# Patient Record
Sex: Male | Born: 1985 | Race: White | Hispanic: No | Marital: Single | Smoking: Current every day smoker
Health system: Southern US, Community
[De-identification: ages and names within clinical notes are randomized; demographics above are authoritative.]

---

## 2015-11-28 ENCOUNTER — Emergency Department (HOSPITAL_COMMUNITY)
Admission: EM | Admit: 2015-11-28 | Discharge: 2015-11-28 | Disposition: A | Discharge status: Home / Self Care | Payer: Self-pay | Attending: Emergency Medicine | Admitting: Emergency Medicine

## 2015-11-28 ENCOUNTER — Encounter (HOSPITAL_COMMUNITY): Payer: Self-pay

## 2015-11-28 ENCOUNTER — Emergency Department (HOSPITAL_COMMUNITY): Payer: Self-pay

## 2015-11-28 DIAGNOSIS — Z791 Long term (current) use of non-steroidal anti-inflammatories (NSAID): Secondary | ICD-10-CM | POA: Insufficient documentation

## 2015-11-28 DIAGNOSIS — R070 Pain in throat: Secondary | ICD-10-CM | POA: Insufficient documentation

## 2015-11-28 DIAGNOSIS — F17200 Nicotine dependence, unspecified, uncomplicated: Secondary | ICD-10-CM | POA: Insufficient documentation

## 2015-11-28 DIAGNOSIS — R079 Chest pain, unspecified: Secondary | ICD-10-CM | POA: Insufficient documentation

## 2015-11-28 DIAGNOSIS — R1013 Epigastric pain: Secondary | ICD-10-CM | POA: Insufficient documentation

## 2015-11-28 LAB — COMPREHENSIVE METABOLIC PANEL
ALBUMIN: 3.9 g/dL (ref 3.5–5.0)
ALT: 17 U/L (ref 17–63)
ANION GAP: 5 (ref 5–15)
AST: 17 U/L (ref 15–41)
Alkaline Phosphatase: 60 U/L (ref 38–126)
BILIRUBIN TOTAL: 0.5 mg/dL (ref 0.3–1.2)
BUN: 11 mg/dL (ref 6–20)
CHLORIDE: 108 mmol/L (ref 101–111)
CO2: 24 mmol/L (ref 22–32)
Calcium: 8.8 mg/dL — ABNORMAL LOW (ref 8.9–10.3)
Creatinine, Ser: 0.68 mg/dL (ref 0.61–1.24)
GFR calc Af Amer: >60 mL/min (ref >60)
GFR calc non Af Amer: >60 mL/min (ref >60)
GLUCOSE: 96 mg/dL (ref 65–99)
POTASSIUM: 4.1 mmol/L (ref 3.5–5.1)
Sodium: 137 mmol/L (ref 135–145)
TOTAL PROTEIN: 6.6 g/dL (ref 6.5–8.1)

## 2015-11-28 LAB — CBC WITH DIFFERENTIAL/PLATELET
BASOS ABS: 0 10*3/uL (ref 0.0–0.1)
BASOS PCT: 0 %
Eosinophils Absolute: 0.3 10*3/uL (ref 0.0–0.7)
Eosinophils Relative: 2 %
HEMATOCRIT: 45.9 % (ref 39.0–52.0)
HEMOGLOBIN: 16 g/dL (ref 13.0–17.0)
LYMPHS PCT: 14 %
Lymphs Abs: 1.5 10*3/uL (ref 0.7–4.0)
MCH: 31.6 pg (ref 26.0–34.0)
MCHC: 34.9 g/dL (ref 30.0–36.0)
MCV: 90.7 fL (ref 78.0–100.0)
MONO ABS: 0.7 10*3/uL (ref 0.1–1.0)
Monocytes Relative: 7 %
NEUTROS ABS: 8.2 10*3/uL — AB (ref 1.7–7.7)
NEUTROS PCT: 77 %
Platelets: 168 10*3/uL (ref 150–400)
RBC: 5.06 MIL/uL (ref 4.22–5.81)
RDW: 12.6 % (ref 11.5–15.5)
WBC: 10.7 10*3/uL — ABNORMAL HIGH (ref 4.0–10.5)

## 2015-11-28 LAB — LIPASE, BLOOD: LIPASE: 16 U/L (ref 11–51)

## 2015-11-28 LAB — TROPONIN I: Troponin I: <0.03 ng/mL (ref <0.03)

## 2015-11-28 MED ORDER — FAMOTIDINE IN NACL 20-0.9 MG/50ML-% IV SOLN: 20.0000 mg | Freq: Once | INTRAVENOUS | Status: DC

## 2015-11-28 MED ORDER — ACETAMINOPHEN 500 MG PO TABS
1000.0000 mg | ORAL_TABLET | Freq: Once | ORAL | Status: AC
Start: 2015-11-28 — End: 2015-11-28
  Administered 2015-11-28: 1000 mg via ORAL
  Filled 2015-11-28: qty 2

## 2015-11-28 MED ORDER — GI COCKTAIL ~~LOC~~
30.0000 mL | Freq: Once | ORAL | Status: AC
  Administered 2015-11-28: 30 mL via ORAL
  Filled 2015-11-28: qty 30

## 2015-11-28 MED ORDER — KETOROLAC TROMETHAMINE 60 MG/2ML IM SOLN: 60.0000 mg | Freq: Once | INTRAMUSCULAR | Status: DC

## 2015-11-28 MED ORDER — KETOROLAC TROMETHAMINE 30 MG/ML IJ SOLN
30.0000 mg | Freq: Once | INTRAMUSCULAR | Status: AC
  Administered 2015-11-28: 30 mg via INTRAVENOUS
  Filled 2015-11-28: qty 1

## 2015-11-28 MED ORDER — FAMOTIDINE 20 MG PO TABS
20.0000 mg | ORAL_TABLET | Freq: Once | ORAL | Status: AC
  Administered 2015-11-28: 20 mg via ORAL
  Filled 2015-11-28: qty 1

## 2015-11-28 MED ORDER — OMEPRAZOLE 20 MG PO CPDR: 20.0000 mg | DELAYED_RELEASE_CAPSULE | Freq: Every day | ORAL | Status: DC

## 2015-11-28 NOTE — ED Notes (Signed)
Complain of pain from epigastric area up to throat. States he felt like his throat was closing up last night . States the pain is worse when bending forward.

## 2015-11-28 NOTE — Discharge Instructions (Signed)
Please quit smoking. Follow up with local physician.  If you were given medicines take as directed.  If you are on coumadin or contraceptives realize their levels and effectiveness is altered by many different medicines.  If you have any reaction (rash, tongues swelling, other) to the medicines stop taking and see a physician.    If your blood pressure was elevated in the ER make sure you follow up for management with a primary doctor or return for chest pain, shortness of breath or stroke symptoms.  Please follow up as directed and return to the ER or see a physician for new or worsening symptoms.  Thank you. Filed Vitals:   11/28/15 1201 11/28/15 1230 11/28/15 1300 11/28/15 1433  BP: 124/71 114/73 122/75 113/75  Pulse: 53 47 67 46  Temp:      TempSrc:      Resp: 16 17 12 18   Height:      Weight:      SpO2: 98% 97% 98% 97%

## 2015-11-28 NOTE — ED Notes (Signed)
Pt ambulating independently w/ steady gait on d/c in no acute distress, A&Ox4. D/c instructions reviewed w/ pt - pt denies any further questions or concerns at present. Rx given x1  

## 2015-11-28 NOTE — ED Provider Notes (Signed)
CSN: [id number]     Arrival date & time 11/28/15  1054 History  By signing my name below, I, Rosario Adie, attest that this documentation has been prepared under the direction and in the presence of Blane Ohara, MD. Electronically Signed: Rosario Adie, ED Scribe. 11/28/2015. 12:06 PM.   Chief Complaint  Patient presents with  . epigastric pain    The history is provided by the patient. No language interpreter was used.   HPI Comments: Jacob Carrillo is a 30 y.o. male with no pertinent PMHx who presents to the Emergency Department complaining of sudden onset, unchanged, intermittent throat pain x 1 day. His pain radiates down his the middle of his chest under his sternum. His pain is exacerbated with deep breaths and leaning forward. He took one dose of Ibuprofen approximately 3 hours PTA with no relief of his episodes. No Hx or FHx of PE/DVT, recent long travel, surgery, fracture, prolonged immobilization, hormone use. No hx of GERD. No recent sickness or illness. He denies leg swelling, nausea, fever, chills. vomiting, or abdominal pain.   History reviewed. No pertinent past medical history. History reviewed. No pertinent past surgical history. No family history on file. Social History  Substance Use Topics  . Smoking status: Current Every Day Smoker  . Smokeless tobacco: None  . Alcohol Use: Yes    Review of Systems  Constitutional: Negative for fever, chills and diaphoresis.  HENT:       Positive for throat pain.  Cardiovascular: Positive for chest pain. Negative for leg swelling.  Gastrointestinal: Negative for nausea, vomiting and abdominal pain.  Neurological: Negative for dizziness.    Allergies  Review of patient's allergies indicates no known allergies.  Home Medications   Prior to Admission medications   Medication Sig Start Date End Date Taking? Authorizing Provider  ibuprofen (ADVIL,MOTRIN) 200 MG tablet Take 600 mg by mouth every 6 (six) hours as  needed for mild pain.   Yes Historical Provider, MD  omeprazole (PRILOSEC) 20 MG capsule Take 1 capsule (20 mg total) by mouth daily. 11/28/15   Blane Ohara, MD   BP 113/75 mmHg  Pulse 46  Temp(Src) 97.4 F (36.3 C) (Oral)  Resp 18  Ht  (1.854 m)  Wt 200 lb (90.719 kg)  BMI 26.39 kg/m2  SpO2 97% Physical Exam  Constitutional: He is oriented to person, place, and time. He appears well-developed and well-nourished.  HENT:  Head: Normocephalic.  Mouth/Throat: Mucous membranes are dry.  Throat is clear. No swelling or exudate.   Eyes: Conjunctivae are normal.  Neck: Normal range of motion. Neck supple.  Cardiovascular: Normal rate.   Pulmonary/Chest: Effort normal and breath sounds normal. No respiratory distress. He has no wheezes. He has no rales.  Lungs CTA bilaterally.  Abdominal: Soft. Bowel sounds are normal. He exhibits no distension. There is no rebound and no guarding.  Mild discomfort in the epigastric region, no peritonitis, no significant tenderness.   Musculoskeletal: Normal range of motion.  No significant leg swelling.   Neurological: He is alert and oriented to person, place, and time.  Skin: Skin is warm and dry.  Psychiatric: He has a normal mood and affect. His behavior is normal.  Nursing note and vitals reviewed.  ED Course  Procedures (including critical care time)  DIAGNOSTIC STUDIES: Oxygen Saturation is 98% on RA, normal by my interpretation.   COORDINATION OF CARE: 12:06 PM-Discussed next steps with pt. Pt verbalized understanding and is agreeable with the plan.  Labs Review Labs Reviewed  COMPREHENSIVE METABOLIC PANEL - Abnormal; Notable for the following:    Calcium 8.8 (*)    All other components within normal limits  CBC WITH DIFFERENTIAL/PLATELET - Abnormal; Notable for the following:    WBC 10.7 (*)    Neutro Abs 8.2 (*)    All other components within normal limits  LIPASE, BLOOD  TROPONIN I    Imaging Review Dg Chest 2  View  11/28/2015  CLINICAL DATA:  Mid to upper chest pain with burning sensation for 3 hours, shortness of breath today. Smoker. EXAM: CHEST  2 VIEW COMPARISON:  None. FINDINGS: Cardiomediastinal silhouette is normal in size and configuration. Lungs are clear. Lung volumes are normal. No evidence of pneumonia. No pleural effusion. No pneumothorax. Osseous and soft tissue structures about the chest are unremarkable. IMPRESSION: No active cardiopulmonary disease. Electronically Signed   By: Bary RichardStan  Maynard M.D.   On: 11/28/2015 12:23    I have personally reviewed and evaluated these images and lab results as part of my medical decision-making.   EKG Interpretation   Date/Time:  Monday November 28 2015 12:00:50 EDT Ventricular Rate:  61 PR Interval:    QRS Duration: 90 QT Interval:  391 QTC Calculation: 394 R Axis:   69 Text Interpretation:  Sinus rhythm Confirmed by Iyona Pehrson MD, Danaysha Kirn [phone]  on 11/28/2015 1:07:49 PM      MDM   Final diagnoses:  Epigastric pain   Patient presents with mild chest discomfort worse with position and inspiration. Patient well-appearing on exam, vitals unremarkable. No FH blood clots.  Lungs are clear no respiratory difficulty. Patient PE RC negative. Patient very low risk cardiac and atypical presentation that started with burning in the throat and now has sensation epigastric region. Medicines given in the ER mild improvement. Discussed close follow-up with primary doctor. Discussed smoking cessation.  Results and differential diagnosis were discussed with the patient/parent/guardian. Xrays were independently reviewed by myself.  Close follow up outpatient was discussed, comfortable with the plan.   Medications  gi cocktail (Maalox,Lidocaine,Donnatal) (30 mLs Oral Given 11/28/15 1215)  acetaminophen (TYLENOL) tablet 1,000 mg (1,000 mg Oral Given 11/28/15 1215)  famotidine (PEPCID) tablet 20 mg (20 mg Oral Given 11/28/15 1215)  ketorolac (TORADOL) 30 MG/ML  injection 30 mg (30 mg Intravenous Given 11/28/15 1323)    Filed Vitals:   11/28/15 1201 11/28/15 1230 11/28/15 1300 11/28/15 1433  BP: 124/71 114/73 122/75 113/75  Pulse: 53 47 67 46  Temp:      TempSrc:      Resp: 16 17 12 18   Height:      Weight:      SpO2: 98% 97% 98% 97%    Final diagnoses:  Epigastric pain      Blane OharaJoshua Lanasia Porras, MD 11/28/15 1459

## 2015-11-28 NOTE — ED Notes (Addendum)
Pt reports acute onset of chest pain that woke him from his sleep, denies any alleviating factors - pain is worse w/ deep inspiration - pt unsure if pain is acid reflux. Pt denies any hx of anxiety/panic attacks. Pt A&Ox4, no acute distress. Placed on cardiac monitor. Pt reports taking ibuprofen at 09:20 and has had no relief in symptoms. Denies dizziness, diaphoresis or n/v.

## 2019-08-05 ENCOUNTER — Emergency Department (HOSPITAL_COMMUNITY)
Admission: EM | Admit: 2019-08-05 | Discharge: 2019-08-05 | Disposition: A | Discharge status: Home / Self Care | Payer: Self-pay | Attending: Emergency Medicine | Admitting: Emergency Medicine

## 2019-08-05 ENCOUNTER — Other Ambulatory Visit: Payer: Self-pay

## 2019-08-05 ENCOUNTER — Encounter (HOSPITAL_COMMUNITY): Payer: Self-pay | Admitting: Emergency Medicine

## 2019-08-05 DIAGNOSIS — J0110 Acute frontal sinusitis, unspecified: Secondary | ICD-10-CM | POA: Insufficient documentation

## 2019-08-05 DIAGNOSIS — F17200 Nicotine dependence, unspecified, uncomplicated: Secondary | ICD-10-CM | POA: Insufficient documentation

## 2019-08-05 DIAGNOSIS — Z20822 Contact with and (suspected) exposure to covid-19: Secondary | ICD-10-CM | POA: Insufficient documentation

## 2019-08-05 LAB — SARS CORONAVIRUS 2 (TAT 6-24 HRS): SARS Coronavirus 2: NEGATIVE

## 2019-08-05 MED ORDER — AMOXICILLIN-POT CLAVULANATE 875-125 MG PO TABS: 1.0000 | ORAL_TABLET | Freq: Two times a day (BID) | ORAL | 0 refills | Status: DC

## 2019-08-05 NOTE — ED Provider Notes (Signed)
[facility] EMERGENCY DEPARTMENT Provider Note   CSN: [id number] Arrival date & time: 08/05/19  1217     History Chief Complaint  Patient presents with  . Nasal Congestion    Jacob Carrillo is a 34 y.o. male with no significant past medical history presents for evaluation of nasal congestion and rhinorrhea.  Symptoms x1 week.  Has been taking Mucinex without relief.  Yellow color to rhinorrhea.  Also admits to some nonproductive cough and congestion drips on the back of his throat.  He denies any headache, lightheadedness, dizziness, sore throat, chest pain, shortness of breath, abdominal pain, diarrhea, dysuria.  Also admits to some frontal sinus pressure.  She notes aggravating relieving factors.  Tolerating p.o. intake at home.  Denies any additional aggravating relieving factors.  History obtained from patient and past medical records.  No interpreter is used.  HPI     History reviewed. No pertinent past medical history.  There are no problems to display for this patient.   History reviewed. No pertinent surgical history.     No family history on file.  Social History   Tobacco Use  . Smoking status: Current Every Day Smoker    Packs/day: 1.00  . Smokeless tobacco: Never Used  Substance Use Topics  . Alcohol use: Yes    Alcohol/week: 7.0 - 10.0 standard drinks    Types: 7 - 10 Shots of liquor per week  . Drug use: Not on file    Home Medications Prior to Admission medications   Medication Sig Start Date End Date Taking? Authorizing Provider  amoxicillin-clavulanate (AUGMENTIN) 875-125 MG tablet Take 1 tablet by mouth every 12 (twelve) hours. 08/05/19   Taleisha Kaczynski A, PA-C  ibuprofen (ADVIL,MOTRIN) 200 MG tablet Take 600 mg by mouth every 6 (six) hours as needed for mild pain.    [provider]  omeprazole (PRILOSEC) 20 MG capsule Take 1 capsule (20 mg total) by mouth daily. 11/28/15   Blane Ohara, MD    Allergies    Patient has no known  allergies.  Review of Systems   Review of Systems  Constitutional: Negative.   HENT: Positive for congestion, postnasal drip, rhinorrhea, sinus pressure and sinus pain. Negative for dental problem, drooling, ear discharge, ear pain, facial swelling, sneezing, sore throat, trouble swallowing and voice change.   Respiratory: Positive for cough. Negative for choking, chest tightness, shortness of breath, wheezing and stridor.   Cardiovascular: Negative.   Gastrointestinal: Negative.   Genitourinary: Negative.   Musculoskeletal: Negative.   Skin: Negative.   Neurological: Negative.   All other systems reviewed and are negative.   Physical Exam Updated Vital Signs BP (!) 142/82 (BP Location: Right Arm)   Pulse 87   Temp 98.1 F (36.7 C) (Oral)   Resp 18   Ht 6\' 1"  (1.854 m)   Wt 108.9 kg   SpO2 99%   BMI 31.66 kg/m   Physical Exam Vitals and nursing note reviewed.  Constitutional:      General: He is not in acute distress.    Appearance: He is not ill-appearing, toxic-appearing or diaphoretic.  HENT:     Head: Normocephalic and atraumatic.     Jaw: There is normal jaw occlusion.     Right Ear: Tympanic membrane, ear canal and external ear normal. There is no impacted cerumen. No hemotympanum. Tympanic membrane is not injected, scarred, perforated, erythematous, retracted or bulging.     Left Ear: Tympanic membrane, ear canal and external ear normal. There is  no impacted cerumen. No hemotympanum. Tympanic membrane is not injected, scarred, perforated, erythematous, retracted or bulging.     Ears:     Comments: No Mastoid tenderness.    Nose: Mucosal edema, congestion and rhinorrhea present. No nasal deformity, signs of injury or nasal tenderness.     Right Turbinates: Enlarged and swollen.     Left Turbinates: Enlarged and swollen.     Right Sinus: Frontal sinus tenderness present. No maxillary sinus tenderness.     Left Sinus: Frontal sinus tenderness present. No maxillary  sinus tenderness.      Comments: Clear rhinorrhea and congestion to bilateral nares.  No sinus tenderness.    Mouth/Throat:     Comments: Posterior oropharynx clear.  Mucous membranes moist.  Tonsils without erythema or exudate.  Uvula midline without deviation.  No evidence of PTA or RPA.  No drooling, dysphasia or trismus.  Phonation normal. Neck:     Trachea: Trachea and phonation normal.     Meningeal: Brudzinski's sign and Kernig's sign absent.     Comments: No Neck stiffness or neck rigidity.  No meningismus.  No cervical lymphadenopathy. Cardiovascular:     Comments: No murmurs rubs or gallops. Pulmonary:     Comments: Clear to auscultation bilaterally without wheeze, rhonchi or rales.  No accessory muscle usage.  Able speak in full sentences. Abdominal:     Comments: Soft, nontender without rebound or guarding.  No CVA tenderness.  Musculoskeletal:     Comments: Moves all 4 extremities without difficulty.  Lower extremities without edema, erythema or warmth.  Skin:    Comments: Brisk capillary refill.  No rashes or lesions.  Neurological:     Mental Status: He is alert.     Comments: Ambulatory in department without difficulty.  Cranial nerves II through XII grossly intact.  No facial droop.  No aphasia.     ED Results / Procedures / Treatments   Labs (all labs ordered are listed, but only abnormal results are displayed) Labs Reviewed  SARS CORONAVIRUS 2 (TAT 6-24 HRS)    EKG None  Radiology No results found.  Procedures Procedures (including critical care time)  Medications Ordered in ED Medications - No data to display  ED Course  I have reviewed the triage vital signs and the nursing notes.  Pertinent labs & imaging results that were available during my care of the patient were reviewed by me and considered in my medical decision making (see chart for details).  34 year old male appears otherwise well presents for evaluation of congestion and rhinorrhea.   Symptoms x1 week.  He is afebrile, nonseptic, non-ill-appearing.  Patient with tenderness over his frontal sinuses as well as congestion yellow rhinorrhea.  Posterior oropharynx clear.  No evidence of PTA or RPA.  Heart and lungs clear.  No chest pain, shortness of breath.  He is without tachycardia, tachypnea or hypoxia.  No known Covid exposures.  Appears clinically well-hydrated.  Given symptoms over a week we will treat for bacterial sinusitis.  Also get Covid test per his work requirements.  He appears overall well.Instructions given for warm saline nasal wash and recommendations for follow-up with primary care physician.    The patient has been appropriately medically screened and/or stabilized in the ED. I have low suspicion for any other emergent medical condition which would require further screening, evaluation or treatment in the ED or require inpatient management.    MDM Rules/Calculators/A&P  Jacob Carrillo was evaluated in Emergency Department on 08/05/2019 for the symptoms described in the history of present illness. He was evaluated in the context of the global COVID-19 pandemic, which necessitated consideration that the patient might be at risk for infection with the SARS-CoV-2 virus that causes COVID-19. Institutional protocols and algorithms that pertain to the evaluation of patients at risk for COVID-19 are in a state of rapid change based on information released by regulatory bodies including the CDC and federal and state organizations. These policies and algorithms were followed during the patient's care in the ED. Final Clinical Impression(s) / ED Diagnoses Final diagnoses:  Acute non-recurrent frontal sinusitis    Rx / DC Orders ED Discharge Orders         Ordered    amoxicillin-clavulanate (AUGMENTIN) 875-125 MG tablet  Every 12 hours     08/05/19 1253           Iwalani Templeton A, PA-C 08/05/19 1257    Long, Arlyss Repress, MD 08/10/19 [phone]

## 2019-08-05 NOTE — Discharge Instructions (Signed)
Take medications as prescribed.  May also do Flonase, Mucinex for congestion.  Follow-up with primary care for reevaluation  Covid test is pending.  You will be notified if positive

## 2019-08-05 NOTE — ED Notes (Signed)
Pt in bed, pt c/o nasal congestion, runny nose and cough.  Pt lungs clear, pt has minor redness to throat, no swelling or white exudate noted.  resps even and unlabored, pt talking in complete sentences.  Pt states that he has had similar episodes in the past that last about three days.

## 2019-08-05 NOTE — ED Triage Notes (Signed)
Pt c/o nasal and chest congestion that began last Thursday. OTC medications have given no relief.

## 2019-08-24 ENCOUNTER — Encounter (HOSPITAL_COMMUNITY): Payer: Self-pay | Admitting: *Deleted

## 2019-08-24 ENCOUNTER — Emergency Department (HOSPITAL_COMMUNITY)
Admission: EM | Admit: 2019-08-24 | Discharge: 2019-08-24 | Disposition: A | Discharge status: Home / Self Care | Payer: Self-pay | Attending: Emergency Medicine | Admitting: Emergency Medicine

## 2019-08-24 ENCOUNTER — Emergency Department (HOSPITAL_COMMUNITY): Payer: Self-pay

## 2019-08-24 DIAGNOSIS — G510 Bell's palsy: Secondary | ICD-10-CM | POA: Insufficient documentation

## 2019-08-24 DIAGNOSIS — F17210 Nicotine dependence, cigarettes, uncomplicated: Secondary | ICD-10-CM | POA: Insufficient documentation

## 2019-08-24 LAB — COMPREHENSIVE METABOLIC PANEL
ALT: 47 U/L — ABNORMAL HIGH (ref 0–44)
AST: 25 U/L (ref 15–41)
Albumin: 3.8 g/dL (ref 3.5–5.0)
Alkaline Phosphatase: 70 U/L (ref 38–126)
Anion gap: 8 (ref 5–15)
BUN: 15 mg/dL (ref 6–20)
CO2: 22 mmol/L (ref 22–32)
Calcium: 8.6 mg/dL — ABNORMAL LOW (ref 8.9–10.3)
Chloride: 104 mmol/L (ref 98–111)
Creatinine, Ser: 0.84 mg/dL (ref 0.61–1.24)
GFR calc Af Amer: >60 mL/min (ref >60)
GFR calc non Af Amer: >60 mL/min (ref >60)
Glucose, Bld: 95 mg/dL (ref 70–99)
Potassium: 4 mmol/L (ref 3.5–5.1)
Sodium: 134 mmol/L — ABNORMAL LOW (ref 135–145)
Total Bilirubin: 0.7 mg/dL (ref 0.3–1.2)
Total Protein: 6.8 g/dL (ref 6.5–8.1)

## 2019-08-24 LAB — CBC WITH DIFFERENTIAL/PLATELET
Abs Immature Granulocytes: 0.03 10*3/uL (ref 0.00–0.07)
Basophils Absolute: 0.1 10*3/uL (ref 0.0–0.1)
Basophils Relative: 1 %
Eosinophils Absolute: 0.3 10*3/uL (ref 0.0–0.5)
Eosinophils Relative: 4 %
HCT: 46 % (ref 39.0–52.0)
Hemoglobin: 16.2 g/dL (ref 13.0–17.0)
Immature Granulocytes: 0 %
Lymphocytes Relative: 28 %
Lymphs Abs: 2.1 10*3/uL (ref 0.7–4.0)
MCH: 32.5 pg (ref 26.0–34.0)
MCHC: 35.2 g/dL (ref 30.0–36.0)
MCV: 92.4 fL (ref 80.0–100.0)
Monocytes Absolute: 0.6 10*3/uL (ref 0.1–1.0)
Monocytes Relative: 8 %
Neutro Abs: 4.3 10*3/uL (ref 1.7–7.7)
Neutrophils Relative %: 59 %
Platelets: 222 10*3/uL (ref 150–400)
RBC: 4.98 MIL/uL (ref 4.22–5.81)
RDW: 12.2 % (ref 11.5–15.5)
WBC: 7.3 10*3/uL (ref 4.0–10.5)
nRBC: 0 % (ref 0.0–0.2)

## 2019-08-24 MED ORDER — HYPROMELLOSE (GONIOSCOPIC) 2.5 % OP SOLN: OPHTHALMIC | 12 refills | Status: DC

## 2019-08-24 MED ORDER — PREDNISONE 20 MG PO TABS: ORAL_TABLET | ORAL | Status: DC

## 2019-08-24 MED ORDER — ARTIFICIAL TEARS OPHTHALMIC OINT: TOPICAL_OINTMENT | OPHTHALMIC | 1 refills | Status: DC

## 2019-08-24 MED ORDER — VALACYCLOVIR HCL 1 G PO TABS: 1000.0000 mg | ORAL_TABLET | Freq: Three times a day (TID) | ORAL | 0 refills | Status: AC

## 2019-08-24 NOTE — ED Notes (Signed)
Pt reports decreased sensation in tongue, inside of nose x2 days. Reports difficulty closing left eye x1 day. Per Pt "I'm not sure if this is related but I was in the middle of falling asleep and my body jerked. It felt like my girlfriend slapped my forehead. I did not feel pain but it scared me. I have felt my body jult before but never the head part".   Pt denies pain at this time. Denies hx of Bell's Palsy. States decreased facial sensation has partially resolved.

## 2019-08-24 NOTE — ED Provider Notes (Signed)
[facility] EMERGENCY DEPARTMENT Provider Note   CSN: [id number] Arrival date & time: 08/24/19  1402     History Chief Complaint  Patient presents with  . Numbness  . Eye Problem    Jacob Carrillo is a 34 y.o. male.  Patient presents with numbness to the left side of his face and inside his tongue.  The history is provided by the patient and medical records. No language interpreter was used.  Weakness Severity:  Mild Onset quality:  Sudden Timing:  Constant Progression:  Worsening Chronicity:  New Context: not alcohol use   Relieved by:  Nothing Worsened by:  Nothing Ineffective treatments:  None tried Associated symptoms: no abdominal pain, no chest pain, no cough, no diarrhea, no frequency, no headaches and no seizures   Risk factors: no anemia        History reviewed. No pertinent past medical history.  There are no problems to display for this patient.   History reviewed. No pertinent surgical history.     No family history on file.  Social History   Tobacco Use  . Smoking status: Current Every Day Smoker    Packs/day: 1.00  . Smokeless tobacco: Never Used  Substance Use Topics  . Alcohol use: Yes    Alcohol/week: 7.0 - 10.0 standard drinks    Types: 7 - 10 Shots of liquor per week  . Drug use: Not on file    Home Medications Prior to Admission medications   Medication Sig Start Date End Date Taking? Authorizing Provider  artificial tears (LACRILUBE) OINT ophthalmic ointment Use in your left eye at night and tape your eye shut 08/24/19   Bethann Berkshire, MD  hydroxypropyl methylcellulose / hypromellose (ISOPTO TEARS / GONIOVISC) 2.5 % ophthalmic solution Use in left eye frequently during the day 08/24/19   Bethann Berkshire, MD  predniSONE (DELTASONE) 20 MG tablet Take 3 tablets once a day for 7 days 08/24/19   Bethann Berkshire, MD  valACYclovir (VALTREX) 1000 MG tablet Take 1 tablet (1,000 mg total) by mouth 3 (three) times daily for 7 days. 08/24/19  08/31/19  Bethann Berkshire, MD    Allergies    Ciprofloxacin  Review of Systems   Review of Systems  Constitutional: Negative for appetite change and fatigue.  HENT: Negative for congestion, ear discharge and sinus pressure.        Patient weakness  Eyes: Negative for discharge.  Respiratory: Negative for cough.   Cardiovascular: Negative for chest pain.  Gastrointestinal: Negative for abdominal pain and diarrhea.  Genitourinary: Negative for frequency and hematuria.  Musculoskeletal: Negative for back pain.  Skin: Negative for rash.  Neurological: Negative for seizures and headaches.  Psychiatric/Behavioral: Negative for hallucinations.    Physical Exam Updated Vital Signs BP 130/90   Pulse 72   Temp 98 F (36.7 C)   Resp 18   Ht 6\' 1"  (1.854 m)   Wt 113.4 kg   SpO2 97%   BMI 32.98 kg/m   Physical Exam Vitals and nursing note reviewed.  Constitutional:      Appearance: He is well-developed.  HENT:     Head: Normocephalic.     Comments: Patient has weakness to his left forehead and numbness to left side of his face.  His left eye seems to stay open.  All this is consistent with Bell's palsy    Nose: Congestion present.  Eyes:     General: No scleral icterus.    Conjunctiva/sclera: Conjunctivae normal.  Neck:  Thyroid: No thyromegaly.  Cardiovascular:     Rate and Rhythm: Normal rate and regular rhythm.     Heart sounds: No murmur. No friction rub. No gallop.   Pulmonary:     Breath sounds: No stridor. No wheezing or rales.  Chest:     Chest wall: No tenderness.  Abdominal:     General: There is no distension.     Tenderness: There is no abdominal tenderness. There is no rebound.  Musculoskeletal:        General: Normal range of motion.     Cervical back: Neck supple.  Lymphadenopathy:     Cervical: No cervical adenopathy.  Skin:    General: Skin is warm.     Findings: No erythema or rash.  Neurological:     Mental Status: He is alert and oriented to  person, place, and time.     Motor: No abnormal muscle tone.     Coordination: Coordination normal.  Psychiatric:        Behavior: Behavior normal.     ED Results / Procedures / Treatments   Labs (all labs ordered are listed, but only abnormal results are displayed) Labs Reviewed  COMPREHENSIVE METABOLIC PANEL - Abnormal; Notable for the following components:      Result Value   Sodium 134 (*)    Calcium 8.6 (*)    ALT 47 (*)    All other components within normal limits  CBC WITH DIFFERENTIAL/PLATELET    EKG None  Radiology CT Head Wo Contrast  Result Date: 08/24/2019 CLINICAL DATA:  Decreased lingual sensation and nasal sensation for 2 days, difficulty closing left eye for 1 day EXAM: CT HEAD WITHOUT CONTRAST TECHNIQUE: Contiguous axial images were obtained from the base of the skull through the vertex without intravenous contrast. COMPARISON:  None. FINDINGS: Brain: No acute infarct or hemorrhage. Lateral ventricles and midline structures appear unremarkable. No acute extra-axial fluid collections. No mass effect. Vascular: No hyperdense vessel or unexpected calcification. Skull: Normal. Negative for fracture or focal lesion. Sinuses/Orbits: No acute finding. Other: There is a 1.5 cm partially calcified subcutaneous nodule within the right frontal scalp, of doubtful clinical significance. IMPRESSION: 1. No acute intracranial process. Electronically Signed   By: Randa Ngo M.D.   On: 08/24/2019 16:36    Procedures Procedures (including critical care time)  Medications Ordered in ED Medications - No data to display  ED Course  I have reviewed the triage vital signs and the nursing notes.  Pertinent labs & imaging results that were available during my care of the patient were reviewed by me and considered in my medical decision making (see chart for details).    MDM Rules/Calculators/A&P                     Labs and CT negative.  Patient with Bell's palsy he will be  treated with Valtrex prednisone Lacri-Lube and follow-up with neurology    This patient presents to the ED for concern of patient weakness, this involves an extensive number of treatment options, and is a complaint that carries with it a high risk of complications and morbidity.  The differential diagnosis includes stroke Bell's palsy   Lab Tests:   I Ordered, reviewed, and interpreted labs, which included CBC and chemistry which were unremarkable  Medicines ordered:     Imaging Studies ordered:   I ordered imaging studies which included CT head and  I independently visualized and interpreted imaging which showed normal  Additional history obtained:   Additional history obtained from old chart  Previous records obtained and reviewed  Consultations Obtained:   Reevaluation:  After the interventions stated above, I reevaluated the patient and found no change in his Bell's palsy  Critical Interventions:  .    Final Clinical Impression(s) / ED Diagnoses Final diagnoses:  Bell's palsy    Rx / DC Orders ED Discharge Orders         Ordered    valACYclovir (VALTREX) 1000 MG tablet  3 times daily     08/24/19 1756    predniSONE (DELTASONE) 20 MG tablet     08/24/19 1756    artificial tears (LACRILUBE) OINT ophthalmic ointment     08/24/19 1756    hydroxypropyl methylcellulose / hypromellose (ISOPTO TEARS / GONIOVISC) 2.5 % ophthalmic solution     08/24/19 1756           Bethann Berkshire, MD 08/24/19 1801

## 2019-08-24 NOTE — Discharge Instructions (Addendum)
Follow up with dr. Gerilyn Pilgrim in 1 week.  Call and make an appointment

## 2019-08-24 NOTE — ED Triage Notes (Signed)
C/o numbness of tongue onset yesterday , states his left eye became paralyzed this am

## 2021-01-11 IMAGING — CT CT HEAD W/O CM
4 series · 17 of 47 positions shown, 19 images · non-contrast
Comparison: None.

CLINICAL DATA: Decreased lingual sensation and nasal sensation for
2 days, difficulty closing left eye for 1 day

EXAM:
CT HEAD WITHOUT CONTRAST
TECHNIQUE: Contiguous axial images were obtained from the base of the skull
through the vertex without intravenous contrast.

[Series 2: head w o · axial · 0.46mm/px · z∈[+1543,+1663]mm · 7 of 32 slices shown, 9 images]
[im 4/32  brain]
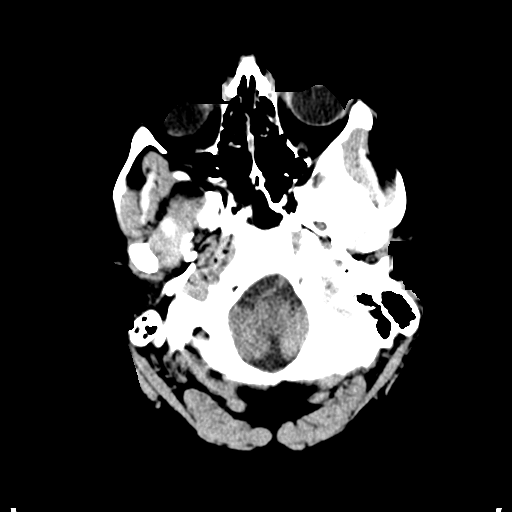
[im 4/32  bone]
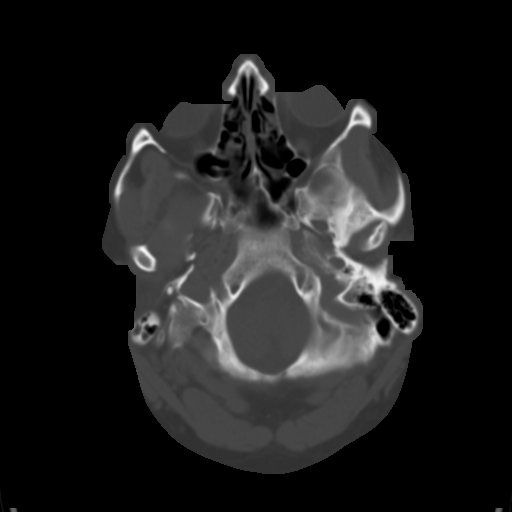
[im 8/32  brain]
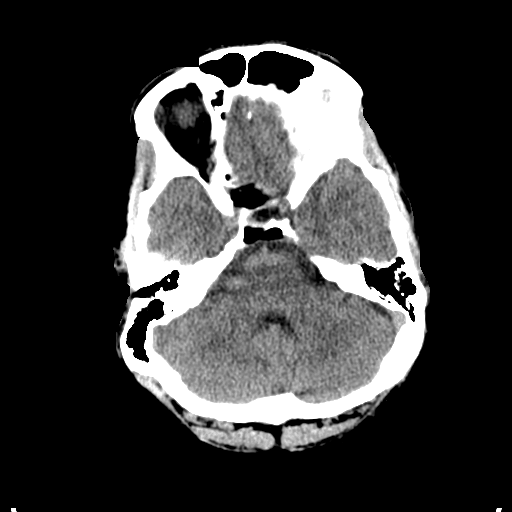
[im 12/32  brain]
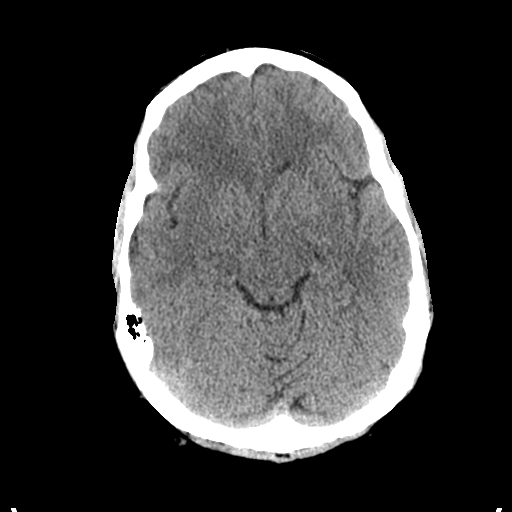
[im 16/32  brain]
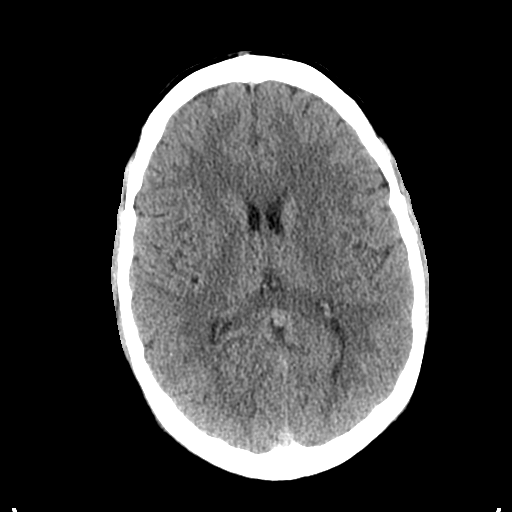
[im 20/32  brain]
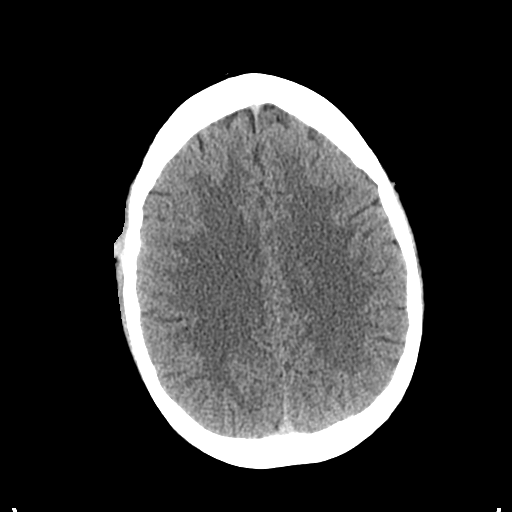
[im 20/32  bone]
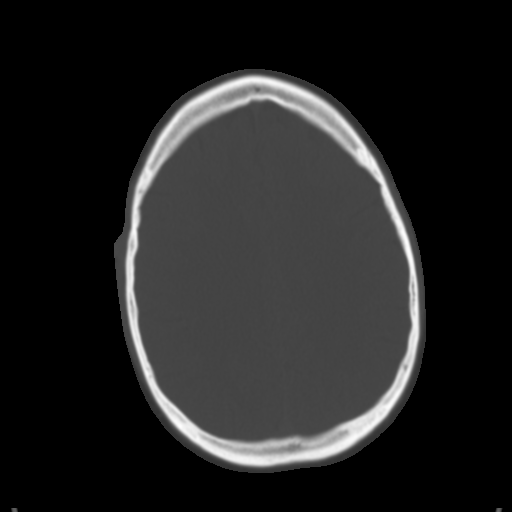
[im 24/32  brain]
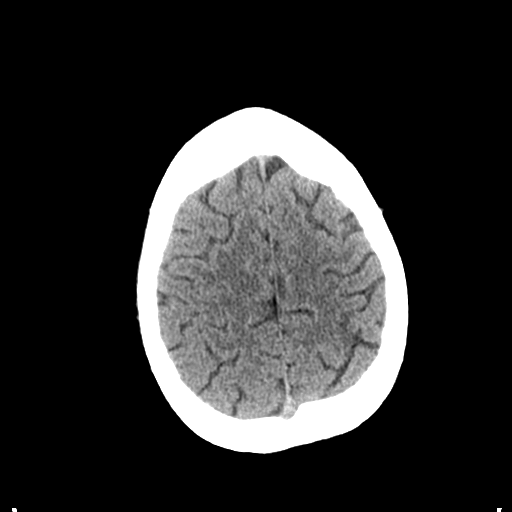
[im 28/32  brain]
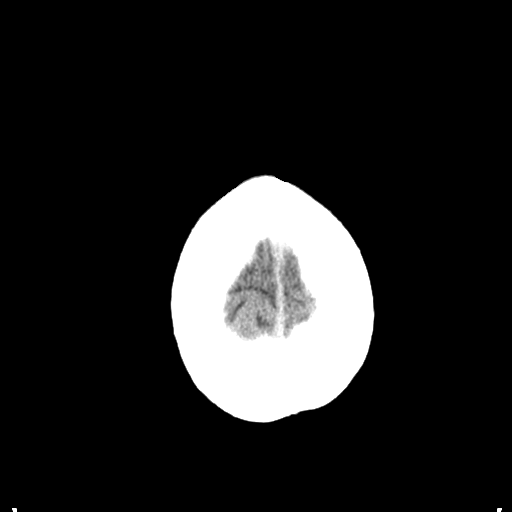

[Series 3: head bone · axial · 0.46mm/px · z∈[+1542,+1598]mm · 4 of 80 slices shown]
[im 8/80  bone]
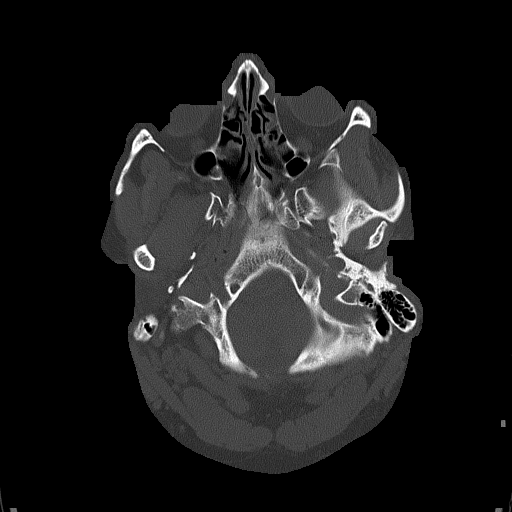
[im 16/80  bone]
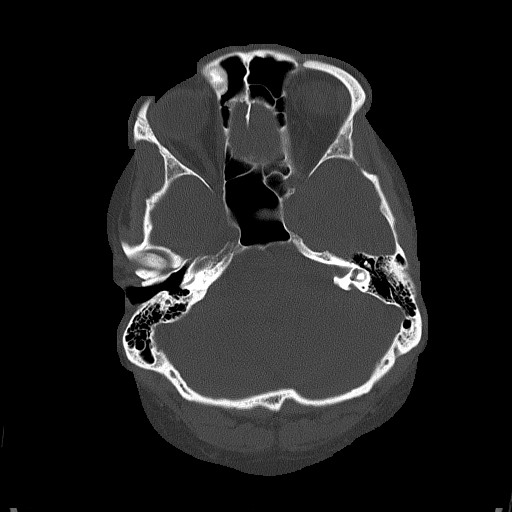
[im 24/80  bone]
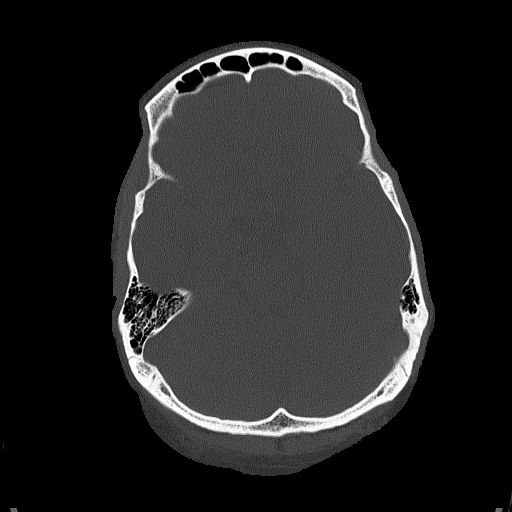
[im 36/80  bone]
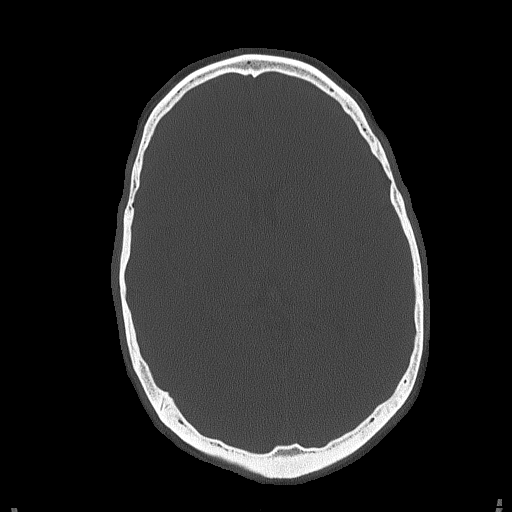

[Series 4: coronal soft · coronal · 0.35mm/px · 3 of 79 slices shown]
[im 27/79  brain]
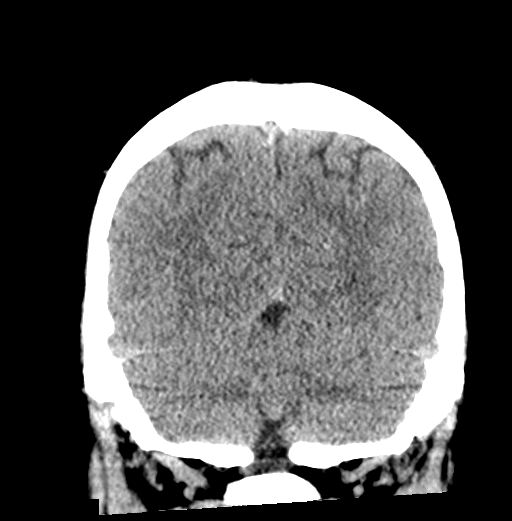
[im 35/79  brain]
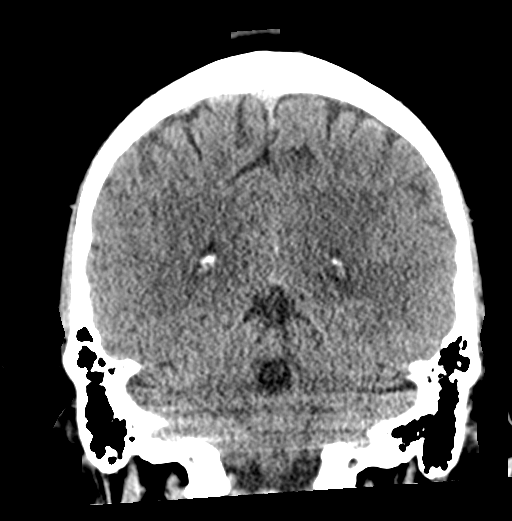
[im 44/79  brain]
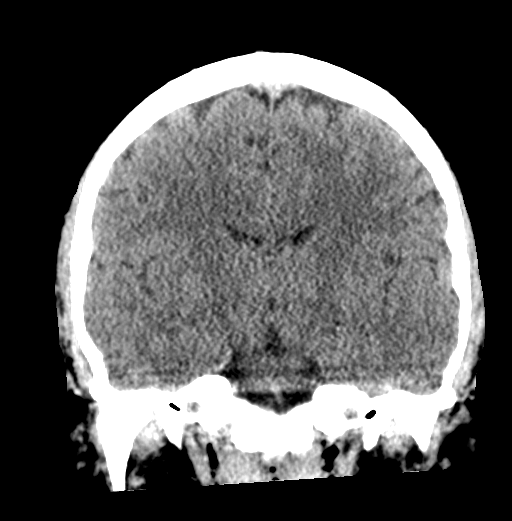

[Series 5: sagittal soft · sagittal · 0.37mm/px · 3 of 60 slices shown]
[im 20/60  brain]
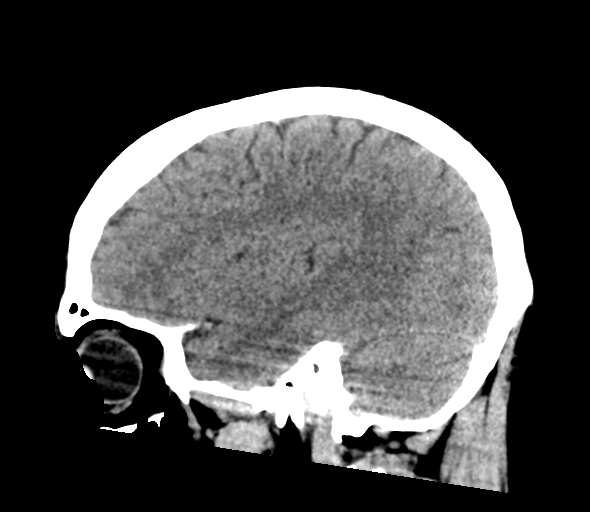
[im 30/60  brain]
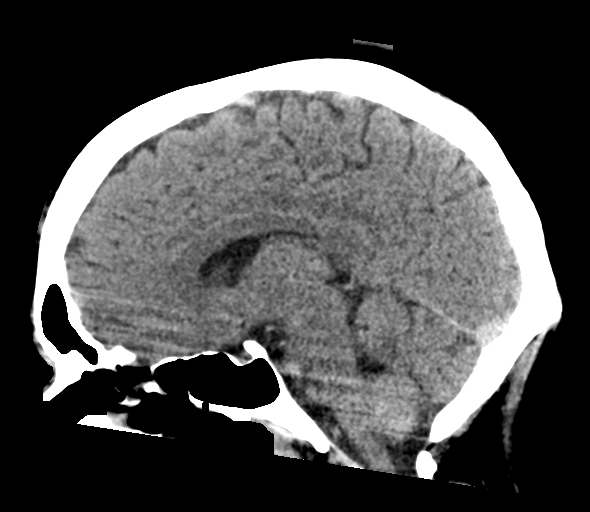
[im 40/60  brain]
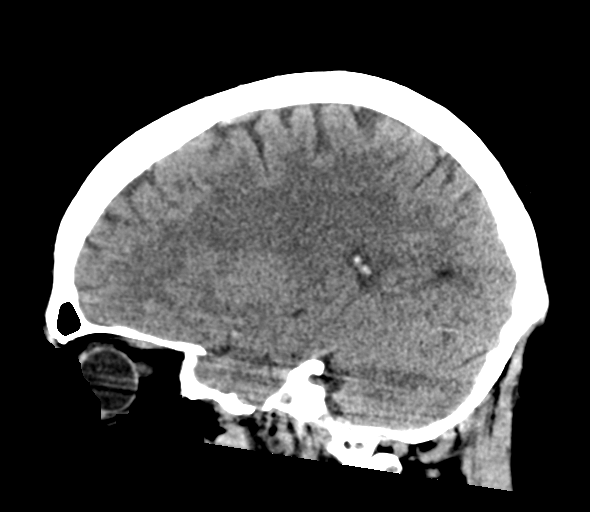

[17 of 47 positions shown; findings below may reference images not displayed]

FINDINGS: Brain: No acute infarct or hemorrhage. Lateral ventricles and
midline structures appear unremarkable. No acute extra-axial fluid
collections. No mass effect.

Vascular: No hyperdense vessel or unexpected calcification.

Skull: Normal. Negative for fracture or focal lesion.

Sinuses/Orbits: No acute finding.

Other: There is a 1.5 cm partially calcified subcutaneous nodule
within the right frontal scalp, of doubtful clinical significance.
IMPRESSION: 1. No acute intracranial process.

## 2023-02-14 ENCOUNTER — Encounter (INDEPENDENT_AMBULATORY_CARE_PROVIDER_SITE_OTHER): Payer: Self-pay | Admitting: *Deleted

## 2023-02-18 ENCOUNTER — Ambulatory Visit (INDEPENDENT_AMBULATORY_CARE_PROVIDER_SITE_OTHER): Payer: Medicaid Other | Admitting: Gastroenterology

## 2023-02-18 ENCOUNTER — Encounter (INDEPENDENT_AMBULATORY_CARE_PROVIDER_SITE_OTHER): Payer: Self-pay | Admitting: Gastroenterology

## 2023-02-18 VITALS — BP 106/74 | HR 88 | Ht 73.0 in | Wt 258.4 lb

## 2023-02-18 DIAGNOSIS — K5909 Other constipation: Secondary | ICD-10-CM

## 2023-02-18 DIAGNOSIS — K5792 Diverticulitis of intestine, part unspecified, without perforation or abscess without bleeding: Secondary | ICD-10-CM

## 2023-02-18 DIAGNOSIS — K5904 Chronic idiopathic constipation: Secondary | ICD-10-CM | POA: Insufficient documentation

## 2023-02-18 DIAGNOSIS — Z6834 Body mass index (BMI) 34.0-34.9, adult: Secondary | ICD-10-CM | POA: Insufficient documentation

## 2023-02-18 MED ORDER — PSYLLIUM 58.6 % PO PACK: 1.0000 | PACK | Freq: Two times a day (BID) | ORAL | 2 refills | Status: AC

## 2023-02-18 MED ORDER — POLYETHYLENE GLYCOL 3350 17 G PO PACK: 17.0000 g | PACK | Freq: Two times a day (BID) | ORAL | 0 refills | Status: AC

## 2023-02-18 NOTE — Progress Notes (Signed)
Jacob Carrillo , M.D. Gastroenterology & Hepatology [facility] Gastroenterology [address] [place], [place] [address] Primary Care Physician: Toma Deiters, MD [address] [place] [address]  Chief Complaint: Recurrent diverticulitis/constipation  History of Present Illness: Jacob Carrillo is a 37 y.o. male with no significant comorbidities who presents for evaluation of recurrent diverticulitis  Patient reports that in past 2 years he had diverticulitis at least 5 times where sometimes he did not seek medical attention due to insurance issues and couple times went to the ER where he was discharged with antibiotics.He was seen in the ER on 02/03/2023 with left lateral chest pain found to have sigmoid colon diverticulitis was sent home on ciprofloxacin 500 mg once daily and metronidazole 500 mg.   Patient reports that he does not have any abdominal pain presently and completed 14-day course of antibiotics  Patient suffers from constipation with Bristol stool scale 1-2 bowel movement every 2 to 3 days with much straining  The patient denies having any nausea, vomiting, fever, chills, hematochezia, melena, hematemesis, diarrhea, jaundice, pruritus or weight loss.  Last EXB:MWUX Last Colonoscopy:none  FHx: neg for any gastrointestinal/liver disease, no malignancies Social: Occasional marijuana use Surgical: no abdominal surgeries  Past Medical History:No past medical history on file.  Past Surgical History:No past surgical history on file.  Family History:No family history on file.  Social History: Social History   Tobacco Use  Smoking Status Every Day   Current packs/day: 1.00   Types: Cigarettes  Smokeless Tobacco Never   Social History   Substance and Sexual Activity  Alcohol Use Yes   Alcohol/week: 7.0 - 10.0 standard drinks of alcohol   Types: 7 - 10 Shots of liquor per week   Social History   Substance and Sexual  Activity  Drug Use Not on file    Allergies: None   Medications: Current Outpatient Medications  Medication Sig Dispense Refill   polyethylene glycol (MIRALAX / GLYCOLAX) 17 g packet Take 17 g by mouth 2 (two) times daily. 180 packet 0   psyllium (METAMUCIL) 58.6 % packet Take 1 packet by mouth 2 (two) times daily. 60 packet 2   No current facility-administered medications for this visit.    Review of Systems: GENERAL: negative for malaise, night sweats HEENT: No changes in hearing or vision, no nose bleeds or other nasal problems. NECK: Negative for lumps, goiter, pain and significant neck swelling RESPIRATORY: Negative for cough, wheezing CARDIOVASCULAR: Negative for chest pain, leg swelling, palpitations, orthopnea GI: SEE HPI MUSCULOSKELETAL: Negative for joint pain or swelling, back pain, and muscle pain. SKIN: Negative for lesions, rash HEMATOLOGY Negative for prolonged bleeding, bruising easily, and swollen nodes. ENDOCRINE: Negative for cold or heat intolerance, polyuria, polydipsia and goiter. NEURO: negative for tremor, gait imbalance, syncope and seizures. The remainder of the review of systems is noncontributory.   Physical Exam: BP 106/74   Pulse 88   Ht 6\' 1"  (1.854 m)   Wt 258 lb 6.4 oz (117.2 kg)   BMI 34.09 kg/m  GENERAL: The patient is AO x3, in no acute distress. HEENT: Head is normocephalic and atraumatic. EOMI are intact. Mouth is well hydrated and without lesions. NECK: Supple. No masses LUNGS: Clear to auscultation. No presence of rhonchi/wheezing/rales. Adequate chest expansion HEART: RRR, normal s1 and s2. ABDOMEN: Soft, nontender, no guarding, no peritoneal signs, and nondistended. BS +. No masses. EXTREMITIES: Without any cyanosis, clubbing, rash, lesions or edema. NEUROLOGIC: AOx3, no focal motor  deficit. SKIN: no jaundice, no rashes   Imaging/Labs: as above     Latest Ref Rng & Units 08/24/2019    4:03 PM 11/28/2015    1:20 PM  CBC   WBC 4.0 - 10.5 K/uL 7.3  10.7   Hemoglobin 13.0 - 17.0 g/dL 16.1  09.6   Hematocrit 39.0 - 52.0 % 46.0  45.9   Platelets 150 - 400 K/uL 222  168    No results found for: "IRON", "TIBC", "FERRITIN"  I personally reviewed and interpreted the available labs, imaging and endoscopic files.  CT 01/2023  IMPRESSION:  1. No evidence of pulmonary embolism or other acute intrathoracic  process.  2. Diffuse mural thickening of the sigmoid colon where there are  extensive diverticula, which may reflect acute on chronic  diverticulitis. Consider correlation with colonoscopy once acute  symptoms have resolved to exclude underlying mass lesion in this  area.   Labs from 10-20 24 with hemoglobin 15.9 platelet 246 Normal liver enzymes creatinine 1.1  Impression and Plan:  Jacob Carrillo is a 37 y.o. male with no significant comorbidities who presents for evaluation of recurrent diverticulitis  #Recurrent diverticulitis #Chronic constipation  The patient and I held a thorough discussion regarding the pathogenesis and natural history of diverticulitis. We discussed that there is no evidence in the literature supporting any specific triggers for diverticulitis besides the use of NSAIDs or high-dose aspirin.   Patient had at least 5 episodes of uncomplicated diverticulitis in the past 2 years.  No follow-up colonoscopies were not done and never had any GI evaluation  Currently is asymptomatic, no abdominal pain and completed 14-day course of antibiotics.  Abdominal exam is benign today  Patient has chronic constipation with Bristol stool scale 1-2 bowel movement every 2 to 3 days  Recommendation  Will schedule for diagnostic colonoscopy in 4 weeks Patient can also to ensure preventing constipation which can trigger diverticulitis Ensure adequate fluid intake: Aim for 8 glasses of water daily. Follow a high fiber diet: Include foods such as dates, prunes, pears, and kiwi. Take Miralax twice a  day for the first week, then reduce to once daily thereafter. Use Metamucil twice a day. If colonoscopy is normal patient may be a candidate for surgical evaluation given recurrent diverticulitis  All questions were answered.      Jacob Lawman, MD Gastroenterology and Hepatology [facility] Gastroenterology   This chart has been completed using [facility] Dictation software, and while attempts have been made to ensure accuracy , certain words and phrases may not be transcribed as intended

## 2023-02-18 NOTE — Patient Instructions (Signed)
It was very nice to meet you today, as dicussed with will plan for the following :  1)Ensure adequate fluid intake: Aim for 8 glasses of water daily. Follow a high fiber diet: Include foods such as dates, prunes, pears, and kiwi. Take Miralax twice a day for the first week, then reduce to once daily thereafter. Use Metamucil twice a day.  2) Colonoscopy after 4 weeks

## 2023-03-25 ENCOUNTER — Telehealth (INDEPENDENT_AMBULATORY_CARE_PROVIDER_SITE_OTHER): Payer: Self-pay | Admitting: *Deleted

## 2023-03-25 MED ORDER — PEG 3350-KCL-NA BICARB-NACL 420 G PO SOLR: 4000.0000 mL | Freq: Once | ORAL | 0 refills | Status: AC

## 2023-03-25 NOTE — Telephone Encounter (Signed)
Spoke with pt. He has been scheduled for colonoscopy with Dr. Tasia Catchings 12/11. Aware will send instructions via mychart. Gave information to log in. He is also aware will send prep rx to pharmacy.

## 2023-04-08 ENCOUNTER — Telehealth (INDEPENDENT_AMBULATORY_CARE_PROVIDER_SITE_OTHER): Payer: Self-pay | Admitting: Gastroenterology

## 2023-04-08 NOTE — Telephone Encounter (Signed)
Pt called in and states he having oral surgery on Wednesday and is wanting to know if it will be ok to have TCS on 04/24/23 as scheduled. Pt states he will have anesthesia for oral surgery also. Please advise. Thank you

## 2023-04-09 NOTE — Telephone Encounter (Signed)
Pt returned call and verbalized understanding

## 2023-04-09 NOTE — Telephone Encounter (Signed)
Left message to return call 

## 2023-04-09 NOTE — Telephone Encounter (Signed)
Hi , That would be atleast 2 weeks from Wednesday and should be ok to proceed . If there are any complications during his oral surgery please ask patient to inform us

## 2023-04-18 ENCOUNTER — Telehealth (INDEPENDENT_AMBULATORY_CARE_PROVIDER_SITE_OTHER): Payer: Self-pay | Admitting: Gastroenterology

## 2023-04-18 NOTE — Telephone Encounter (Signed)
Pt returned call. Pt would like to wait until January. Will call pt with January schedule.

## 2023-04-18 NOTE — Telephone Encounter (Signed)
Pt left message needing to reschedule TCS that is scheduled for 04/24/23 at 1:15pm with Dr.Ahmed.  Returned call to pt but had to leave message

## 2023-04-24 ENCOUNTER — Ambulatory Visit (HOSPITAL_COMMUNITY): Admit: 2023-04-24 | Payer: Medicaid Other

## 2023-04-24 ENCOUNTER — Encounter (HOSPITAL_COMMUNITY): Payer: Self-pay

## 2023-04-24 SURGERY — COLONOSCOPY WITH PROPOFOL
Anesthesia: Monitor Anesthesia Care

## 2023-05-09 NOTE — Telephone Encounter (Signed)
Left message for pt to return call.  TCS ASA 1-2 Ahmed
